# Patient Record
Sex: Male | Born: 1980 | Race: White | Hispanic: No | Marital: Married | Smoking: Never smoker
Health system: Southern US, Community
[De-identification: ages and names within clinical notes are randomized; demographics above are authoritative.]

## PROBLEM LIST (undated history)

## (undated) HISTORY — PX: APPENDECTOMY: SHX54

---

## 2017-09-22 ENCOUNTER — Emergency Department (HOSPITAL_COMMUNITY)
Admission: EM | Admit: 2017-09-22 | Discharge: 2017-09-22 | Disposition: A | Discharge status: Home / Self Care | Payer: 59 | Attending: Emergency Medicine | Admitting: Emergency Medicine

## 2017-09-22 ENCOUNTER — Other Ambulatory Visit: Payer: Self-pay

## 2017-09-22 ENCOUNTER — Encounter (HOSPITAL_COMMUNITY): Payer: Self-pay

## 2017-09-22 ENCOUNTER — Emergency Department (HOSPITAL_COMMUNITY): Payer: 59

## 2017-09-22 DIAGNOSIS — M545 Low back pain, unspecified: Secondary | ICD-10-CM

## 2017-09-22 DIAGNOSIS — Z79899 Other long term (current) drug therapy: Secondary | ICD-10-CM | POA: Diagnosis not present

## 2017-09-22 MED ORDER — METHOCARBAMOL 500 MG PO TABS: 500.0000 mg | ORAL_TABLET | Freq: Two times a day (BID) | ORAL | 0 refills | Status: DC

## 2017-09-22 MED ORDER — KETOROLAC TROMETHAMINE 30 MG/ML IJ SOLN
30.0000 mg | Freq: Once | INTRAMUSCULAR | Status: AC
  Administered 2017-09-22: 30 mg via INTRAMUSCULAR
  Filled 2017-09-22: qty 1

## 2017-09-22 MED ORDER — OXYCODONE HCL 5 MG PO TABS
5.0000 mg | ORAL_TABLET | Freq: Once | ORAL | Status: AC
  Administered 2017-09-22: 5 mg via ORAL
  Filled 2017-09-22: qty 1

## 2017-09-22 MED ORDER — IBUPROFEN 600 MG PO TABS: 600.0000 mg | ORAL_TABLET | Freq: Four times a day (QID) | ORAL | 0 refills | Status: DC | PRN

## 2017-09-22 MED ORDER — METHOCARBAMOL 500 MG PO TABS
500.0000 mg | ORAL_TABLET | Freq: Once | ORAL | Status: AC
  Administered 2017-09-22: 500 mg via ORAL
  Filled 2017-09-22: qty 1

## 2017-09-22 MED ORDER — ACETAMINOPHEN 500 MG PO TABS: 500.0000 mg | ORAL_TABLET | Freq: Four times a day (QID) | ORAL | 0 refills | Status: DC | PRN

## 2017-09-22 NOTE — ED Notes (Signed)
Pt verbalized understanding discharge instructions and denies any further needs or questions at this time. VS stable, ambulatory and steady gait.   

## 2017-09-22 NOTE — ED Triage Notes (Signed)
Pt states he was at the gym and went to sit up from bench and had sudden severe back pain. Pt states he is unable to stand up straight and had difficulty walking. Pt denies numbness or tingling.

## 2017-09-22 NOTE — Discharge Instructions (Signed)
Please see the information and instructions below regarding your visit.  Your diagnoses today include:  1. Acute bilateral low back pain without sciatica    About diagnosis. Most episodes of acute low back pain are self-limited. Your exam was reassuring today that the source of your pain is not affecting the spinal cord and nerves that originate in the spinal cord.   If you have a history of disc herniation or arthritis in your spine, the nerves exiting the spine on one side get inflamed. This can cause severe pain. We call this radiculopathy. We do not always know what causes the sudden inflammation.  Tests performed today include: See side panel of your discharge paperwork for testing performed today. Vital signs are listed at the bottom of these instructions.   X-ray showed no fracture or malalignment.  Medications prescribed:    Take any prescribed medications only as prescribed, and any over the counter medications only as directed on the packaging.  You are prescribed Robaxin, a muscle relaxant. Some common side effects of this medication include:  Feeling sleepy.  Dizziness. Take care upon going from a seated to a standing position.  Dry mouth.  Feeling tired or weak.  Hard stools (constipation).  Upset stomach. These are not all of the side effects that may occur. If you have questions about side effects, call your doctor. Call your primary care provider for medical advice about side effects.  This medication can be sedating. Only take this medication as needed. Please do not combine with alcohol. Do not drive or operate machinery while taking this medication.   This medication can interact with some other medications. Make sure to tell any provider you are taking this medication before they prescribe you a new medication.   You are prescribed ibuprofen, a non-steroidal anti-inflammatory agent (NSAID) for pain. You may take 600mg  every 6 hours as needed for pain. If still  requiring this medication around the clock for acute pain after 10 days, please see your primary healthcare provider.  Women who are pregnant, breastfeeding, or planning on becoming pregnant should not take non-steroidal anti-inflammatories such as Advil and Aleve. Tylenol is a safe over the counter pain reliever in pregnant women.  You may combine this medication with Tylenol, 1000 mg every 6 hours, so you are receiving something for pain every 3 hours.  This is not a long-term medication unless under the care and direction of your primary provider. Taking this medication long-term and not under the supervision of a healthcare provider could increase the risk of stomach ulcers, kidney problems, and cardiovascular problems such as high blood pressure.   You may also apply salon pas patches to your back where you are experiencing pain.  These are over-the-counter.  Home care instructions:   Low back pain gets worse the longer you stay stationary. Please keep moving and walking as tolerated. There are exercises included in this packet to perform as tolerated for your low back pain.   Apply heat to the areas that are painful. Avoid twisting or bending your trunk to lift something. Do not lift anything above 25 lbs while recovering from this flare of low back pain.  Please follow any educational materials contained in this packet.   Follow-up instructions: Please follow-up with your primary care provider as needed for further evaluation of your symptoms if they are not completely improved.   Please follow up with Dr. Carola Frost of sports medicine.  Return instructions:  Please return to the Emergency Department  if you experience worsening symptoms.  Please return for any fever or chills in the setting of your back pain, weakness in the muscles of the legs, numbness in your legs and feet that is new or changing, numbness in the area where you wipe, retention of your urine, loss of bowel or bladder  control, or problems with walking. Please return if you have any other emergent concerns.  Additional Information:   Your vital signs today were: BP 125/72    Pulse 95    Temp 97.8 F (36.6 C) (Oral)    Resp 18    Ht 6\' 1"  (1.854 m)    Wt 104.3 kg (230 lb)    SpO2 99%    BMI 30.34 kg/m  If your blood pressure (BP) was elevated on multiple readings during this visit above 130 for the top number or above 80 for the bottom number, please have this repeated by your primary care provider within one month. --------------  Thank you for allowing us to participate in your care today.

## 2017-09-22 NOTE — ED Provider Notes (Signed)
[facility] EMERGENCY DEPARTMENT Provider Note   CSN: [id number] Arrival date & time: 09/22/17  1119     History   Chief Complaint Chief Complaint  Patient presents with  . Back Pain    HPI Antonio Powers is a 37 y.o. male.  HPI   Patient is a 37 year old male with no significant past medical history presenting for low back pain after lifting weights this morning.  Patient reports he was lifting from a squatting position when he felt a sudden "spasm" in a bandlike pattern across his lower back.  Patient reports is been persistently painful since and he cannot stand up straight due to the pain.  Patient denies any weakness or numbness in lower extremities, saddle anesthesia, loss of bowel or bladder control, urinary retention with overflow incontinence.  Patient denies any history of cancer, IVDU, or spinal procedures.  No remedies tried prior to arrival for symptoms.  History reviewed. No pertinent past medical history.  There are no active problems to display for this patient.   History reviewed. No pertinent surgical history.      Home Medications    Prior to Admission medications   Medication Sig Start Date End Date Taking? Authorizing Provider  Multiple Vitamins-Minerals (MULTIVITAMIN WITH MINERALS) tablet Take 1 tablet by mouth daily.   Yes [provider]  naproxen sodium (ALEVE) 220 MG tablet Take 440 mg by mouth.   Yes [provider]  Protein POWD Take 1 Scoop by mouth daily. DR jim protein power   Yes [provider]    Family History History reviewed. No pertinent family history.  Social History Social History   Tobacco Use  . Smoking status: Never Smoker  . Smokeless tobacco: Never Used  Substance Use Topics  . Alcohol use: Not Currently    Frequency: Never  . Drug use: Never     Allergies   Patient has no known allergies.   Review of Systems Review of Systems  Constitutional: Negative for  chills and fever.  Gastrointestinal: Negative for abdominal pain, nausea and vomiting.  Genitourinary: Negative for difficulty urinating.  Musculoskeletal: Positive for arthralgias and back pain.  Neurological: Negative for weakness and numbness.     Physical Exam Updated Vital Signs BP 125/72   Pulse 95   Temp 97.8 F (36.6 C) (Oral)   Resp 18   Ht 6\' 1"  (1.854 m)   Wt 104.3 kg (230 lb)   SpO2 99%   BMI 30.34 kg/m   Physical Exam  Constitutional: He appears well-developed and well-nourished. No distress.  Sitting comfortably in bed.  HENT:  Head: Normocephalic and atraumatic.  Eyes: Conjunctivae are normal. Right eye exhibits no discharge. Left eye exhibits no discharge.  EOMs normal to gross examination.  Neck: Normal range of motion.  Cardiovascular: Normal rate and regular rhythm.  Intact, 2+ DP pulses b/l  Pulmonary/Chest:  Normal respiratory effort. Patient converses comfortably. No audible wheeze or stridor.  Abdominal: He exhibits no distension.  Musculoskeletal:  Spine Exam: Inspection/Palpation: No midline tenderness palpation of cervical, thoracic, or lumbar spine.  Patient has diffuse paraspinal muscular tenderness in the upper lumbar region. Strength: 5/5 throughout LE bilaterally (hip flexion/extension, adduction/abduction; knee flexion/extension; foot dorsiflexion/plantarflexion, inversion/eversion; great toe inversion) Sensation: Intact to light touch in proximal and distal LE bilaterally Reflexes: 3+ quadriceps and achilles reflexes, but symmetric.  Neurological: He is alert.  Cranial nerves intact to gross observation. Patient moves extremities without difficulty.  Skin: Skin is warm and dry. He  is not diaphoretic.  Psychiatric: He has a normal mood and affect. His behavior is normal. Judgment and thought content normal.  Nursing note and vitals reviewed.    ED Treatments / Results  Labs (all labs ordered are listed, but only abnormal results are  displayed) Labs Reviewed - No data to display  EKG Powers  Radiology Dg Lumbar Spine Complete  Result Date: 09/22/2017 CLINICAL DATA:  Pain after lifting weights EXAM: LUMBAR SPINE - COMPLETE 4+ VIEW COMPARISON:  Powers. FINDINGS: Frontal, lateral, spot lumbosacral lateral, and bilateral oblique views were obtained. There are 5 non-rib-bearing lumbar type vertebral bodies. There is slight lumbar levoscoliosis. There is no fracture or spondylolisthesis. Disc spaces appear normal. There is no appreciable facet arthropathy. IMPRESSION: Slight scoliosis. No fracture or spondylolisthesis. No evident arthropathy. Electronically Signed   By: Bretta BangWilliam  Woodruff III M.D.   On: 09/22/2017 15:35    Procedures Procedures (including critical care time)  Medications Ordered in ED Medications  oxyCODONE (Oxy IR/ROXICODONE) immediate release tablet 5 mg (5 mg Oral Given 09/22/17 1424)  ketorolac (TORADOL) 30 MG/ML injection 30 mg (30 mg Intramuscular Given 09/22/17 1424)  methocarbamol (ROBAXIN) tablet 500 mg (500 mg Oral Given 09/22/17 1424)     Initial Impression / Assessment and Plan / ED Course  I have reviewed the triage vital signs and the nursing notes.  Pertinent labs & imaging results that were available during my care of the patient were reviewed by me and considered in my medical decision making (see chart for details).     Patient denies any concerning symptoms suggestive of cauda equina requiring urgent imaging at this time such as loss of sensation in the lower extremities, lower extremity weakness, loss of bowel or bladder control, saddle anesthesia, urinary retention, fever/chills, IVDU. Exam demonstrated no  weakness today.  On reevaluation after pain control, patient was more ambulatory. Patient had acute onset of pain while lifting weights, but normal x-ray and no midline tenderness.  Patient strict symptomatically with muscle relaxants, NSAIDs, acetaminophen, and rest with restrictions for  lifting.  Patient given strict return precautions for any symptoms indicating worsening neurologic function in the lower extremities.  Patient and family are in understanding and agree with the plan of care.  Final Clinical Impressions(s) / ED Diagnoses   Final diagnoses:  Acute bilateral low back pain without sciatica    ED Discharge Orders        Ordered    methocarbamol (ROBAXIN) 500 MG tablet  2 times daily     09/22/17 1649    ibuprofen (ADVIL,MOTRIN) 600 MG tablet  Every 6 hours PRN     09/22/17 1649    acetaminophen (TYLENOL) 500 MG tablet  Every 6 hours PRN     09/22/17 1649       Elisha PonderMurray, Joseph Johns B, PA-C 09/22/17 1828    Terrilee FilesButler, Masud C, MD 09/22/17 [phone]

## 2018-03-06 ENCOUNTER — Other Ambulatory Visit: Payer: Self-pay | Admitting: Orthopedic Surgery

## 2018-03-06 DIAGNOSIS — M5416 Radiculopathy, lumbar region: Secondary | ICD-10-CM

## 2018-03-13 ENCOUNTER — Ambulatory Visit
Admission: RE | Admit: 2018-03-13 | Discharge: 2018-03-13 | Disposition: A | Discharge status: Home / Self Care | Payer: 59 | Source: Ambulatory Visit | Attending: Orthopedic Surgery | Admitting: Orthopedic Surgery

## 2018-03-13 DIAGNOSIS — M5416 Radiculopathy, lumbar region: Secondary | ICD-10-CM

## 2018-03-13 MED ORDER — METHYLPREDNISOLONE ACETATE 40 MG/ML INJ SUSP (RADIOLOG
120.0000 mg | Freq: Once | INTRAMUSCULAR | Status: AC
  Administered 2018-03-13: 120 mg via EPIDURAL

## 2018-03-13 MED ORDER — IOPAMIDOL (ISOVUE-M 200) INJECTION 41%
1.0000 mL | Freq: Once | INTRAMUSCULAR | Status: AC
  Administered 2018-03-13: 1 mL via EPIDURAL

## 2018-03-13 NOTE — Discharge Instructions (Signed)

## 2018-03-31 ENCOUNTER — Other Ambulatory Visit: Payer: Self-pay | Admitting: Orthopedic Surgery

## 2018-03-31 DIAGNOSIS — M5416 Radiculopathy, lumbar region: Secondary | ICD-10-CM

## 2018-04-02 ENCOUNTER — Ambulatory Visit
Admission: RE | Admit: 2018-04-02 | Discharge: 2018-04-02 | Disposition: A | Discharge status: Home / Self Care | Payer: 59 | Source: Ambulatory Visit | Attending: Orthopedic Surgery | Admitting: Orthopedic Surgery

## 2018-04-02 DIAGNOSIS — M5416 Radiculopathy, lumbar region: Secondary | ICD-10-CM

## 2018-04-02 MED ORDER — METHYLPREDNISOLONE ACETATE 40 MG/ML INJ SUSP (RADIOLOG
120.0000 mg | Freq: Once | INTRAMUSCULAR | Status: AC
  Administered 2018-04-02: 120 mg via EPIDURAL

## 2018-04-02 MED ORDER — IOPAMIDOL (ISOVUE-M 200) INJECTION 41%
1.0000 mL | Freq: Once | INTRAMUSCULAR | Status: AC
  Administered 2018-04-02: 1 mL via EPIDURAL

## 2018-07-18 ENCOUNTER — Other Ambulatory Visit: Payer: Self-pay | Admitting: Orthopedic Surgery

## 2018-07-18 DIAGNOSIS — M5416 Radiculopathy, lumbar region: Secondary | ICD-10-CM

## 2018-08-04 ENCOUNTER — Ambulatory Visit
Admission: RE | Admit: 2018-08-04 | Discharge: 2018-08-04 | Disposition: A | Discharge status: Home / Self Care | Payer: 59 | Source: Ambulatory Visit | Attending: Orthopedic Surgery | Admitting: Orthopedic Surgery

## 2018-08-04 ENCOUNTER — Other Ambulatory Visit: Payer: Self-pay

## 2018-08-04 DIAGNOSIS — M5416 Radiculopathy, lumbar region: Secondary | ICD-10-CM

## 2018-08-04 MED ORDER — IOPAMIDOL (ISOVUE-M 200) INJECTION 41%
1.0000 mL | Freq: Once | INTRAMUSCULAR | Status: AC
  Administered 2018-08-04: 1 mL via EPIDURAL

## 2018-08-04 MED ORDER — METHYLPREDNISOLONE ACETATE 40 MG/ML INJ SUSP (RADIOLOG
120.0000 mg | Freq: Once | INTRAMUSCULAR | Status: AC
  Administered 2018-08-04: 120 mg via EPIDURAL

## 2018-08-04 NOTE — Discharge Instructions (Signed)

## 2018-09-12 ENCOUNTER — Other Ambulatory Visit: Payer: Self-pay | Admitting: Orthopedic Surgery

## 2018-09-19 ENCOUNTER — Encounter (HOSPITAL_COMMUNITY): Payer: Self-pay | Admitting: Vascular Surgery

## 2018-09-19 NOTE — Pre-Procedure Instructions (Signed)
Antonio CowmanMichael Powers  09/19/2018      [facility] [place] [address] [place] [address] Phone: [phone] Fax: [phone]    Your procedure is scheduled on July 23  Report [facility] En[facility] A at 8:30 A.M.  Call this number if you have problems the morning of surgery:  [phone]   Remember:  Do not eat  after midnight.  You may drink clear liquids until 7:30A.M.  Clear liquids allowed are:                    Water, Juice (non-citric and without pulp), Carbonated beverages, Clear Tea, Black Coffee only, Plain Jell-O only, Gatorade and Plain Popsicles only    Take these medicines the morning of surgery with A SIP OF WATER :              Tylenol #3 if needed              Gabapentin (neurontin) if needed                          7 days prior to surgery STOP taking any Aspirin (unless otherwise instructed by your surgeon), Aleve, Naproxen, Ibuprofen, Motrin, Advil, Goody's, BC's, all herbal medications, fish oil, and all vitamins.     Do not wear jewelry.  Do not wear lotions, powders, or perfumes, or deodorant.  Do not shave 48 hours prior to surgery.  Men may shave face and neck.  Do not bring valuables to the hospital.  [facility] any belongings or valuables.  Contacts, dentures or bridgework may not be worn into surgery.  Leave your suitcase in the car.  After surgery it may be brought to your room.  For patients admitted to the hospital, discharge time will be determined by your treatment team.  Patients discharged the day of surgery will not be allowed to drive home.    Special instructions:   [place]- Preparing For Surgery  Before surgery, you can play an important role. Because skin is not sterile, your skin needs to be as free of germs as possible. You can reduce the number of germs on your skin by washing with CHG (chlorahexidine gluconate) Soap before surgery.  CHG is  an antiseptic cleaner which kills germs and bonds with the skin to continue killing germs even after washing.    Oral Hygiene is also important to reduce your risk of infection.  Remember - BRUSH YOUR TEETH THE MORNING OF SURGERY WITH YOUR REGULAR TOOTHPASTE  Please do not use if you have an allergy to CHG or antibacterial soaps. If your skin becomes reddened/irritated stop using the CHG.  Do not shave (including legs and underarms) for at least 48 hours prior to first CHG shower. It is OK to shave your face.  Please follow these instructions carefully.   1. Shower the NIGHT BEFORE SURGERY and the MORNING OF SURGERY with CHG.   2. If you chose to wash your hair, wash your hair first as usual with your normal shampoo.  3. After you shampoo, rinse your hair and body thoroughly to remove the shampoo.  4. Use CHG as you would any other liquid soap. You can apply CHG directly to the skin and wash gently with a scrungie or a clean washcloth.   5. Apply the CHG Soap to your body ONLY FROM  THE NECK DOWN.  Do not use on open wounds or open sores. Avoid contact with your eyes, ears, mouth and genitals (private parts). Wash Face and genitals (private parts)  with your normal soap.  6. Wash thoroughly, paying special attention to the area where your surgery will be performed.  7. Thoroughly rinse your body with warm water from the neck down.  8. DO NOT shower/wash with your normal soap after using and rinsing off the CHG Soap.  9. Pat yourself dry with a CLEAN TOWEL.  10. Wear CLEAN PAJAMAS to bed the night before surgery, wear comfortable clothes the morning of surgery  11. Place CLEAN SHEETS on your bed the night of your first shower and DO NOT SLEEP WITH PETS.    Day of Surgery:  Do not apply any deodorants/lotions.  Please wear clean clothes to the hospital/surgery center.   Remember to brush your teeth WITH YOUR REGULAR TOOTHPASTE.    Please read over the following fact sheets that  you were given. Coughing and Deep Breathing and Surgical Site Infection Prevention

## 2018-09-22 ENCOUNTER — Other Ambulatory Visit (HOSPITAL_COMMUNITY): Payer: 59

## 2018-09-22 ENCOUNTER — Inpatient Hospital Stay (HOSPITAL_COMMUNITY)
Admission: RE | Admit: 2018-09-22 | Discharge: 2018-09-22 | Disposition: A | Discharge status: Home / Self Care | Payer: 59 | Source: Ambulatory Visit

## 2018-09-25 ENCOUNTER — Inpatient Hospital Stay: Admit: 2018-09-25 | Payer: 59 | Admitting: Orthopedic Surgery

## 2018-09-29 NOTE — Pre-Procedure Instructions (Signed)
Antonio Powers  09/29/2018      [facility] - [place], [place] - [place] [address] Phone: [phone] Fax: [phone]    Your procedure is scheduled on Aug. 6  Report to [facility] Entrance A at 1:00 P.M.  Call this number if you have problems the morning of surgery:  [phone]   Remember:  Do not eat  after midnight.  You may drink clear liquids until 12:00 P.M.  Clear liquids allowed are:                    Water, Juice (non-citric and without pulp), Carbonated beverages, Clear Tea, Black Coffee only, Plain Jell-O only, Gatorade and Plain Popsicles only                Please complete your PRE-SURGERY ENSURE that was provided to you by 12:00 noon the morning of surgery.  Please, if able, drink it in one setting. DO NOT SIP.    Take these medicines the morning of surgery with A SIP OF WATER :              Tylenol #3 if needed             Gabapentin (neurontin)               7 days prior to surgery STOP taking any Aspirin (unless otherwise instructed by your surgeon), Aleve, Naproxen, Ibuprofen, Motrin, Advil, Goody's, BC's, all herbal medications, fish oil, and all vitamins.      Do not wear jewelry.  Do not wear lotions, powders, or perfumes, or deodorant.  Do not shave 48 hours prior to surgery.  Men may shave face and neck.  Do not bring valuables to the hospital.  [facility] is not responsible for any belongings or valuables.  Contacts, dentures or bridgework may not be worn into surgery.  Leave your suitcase in the car.  After surgery it may be brought to your room.  For patients admitted to the hospital, discharge time will be determined by your treatment team.  Patients discharged the day of surgery will not be allowed to drive home.    Special instructions:   [place]- Preparing For Surgery  Before surgery, you can play an important role. Because skin is not sterile, your skin needs to  be as free of germs as possible. You can reduce the number of germs on your skin by washing with CHG (chlorahexidine gluconate) Soap before surgery.  CHG is an antiseptic cleaner which kills germs and bonds with the skin to continue killing germs even after washing.    Oral Hygiene is also important to reduce your risk of infection.  Remember - BRUSH YOUR TEETH THE MORNING OF SURGERY WITH YOUR REGULAR TOOTHPASTE  Please do not use if you have an allergy to CHG or antibacterial soaps. If your skin becomes reddened/irritated stop using the CHG.  Do not shave (including legs and underarms) for at least 48 hours prior to first CHG shower. It is OK to shave your face.  Please follow these instructions carefully.   1. Shower the NIGHT BEFORE SURGERY and the MORNING OF SURGERY with CHG.   2. If you chose to wash your hair, wash your hair first as usual with your normal shampoo.  3. After you shampoo, rinse your hair and body thoroughly to remove the shampoo.  4. Use CHG as you would any other  liquid soap. You can apply CHG directly to the skin and wash gently with a scrungie or a clean washcloth.   5. Apply the CHG Soap to your body ONLY FROM THE NECK DOWN.  Do not use on open wounds or open sores. Avoid contact with your eyes, ears, mouth and genitals (private parts). Wash Face and genitals (private parts)  with your normal soap.  6. Wash thoroughly, paying special attention to the area where your surgery will be performed.  7. Thoroughly rinse your body with warm water from the neck down.  8. DO NOT shower/wash with your normal soap after using and rinsing off the CHG Soap.  9. Pat yourself dry with a CLEAN TOWEL.  10. Wear CLEAN PAJAMAS to bed the night before surgery, wear comfortable clothes the morning of surgery  11. Place CLEAN SHEETS on your bed the night of your first shower and DO NOT SLEEP WITH PETS.    Day of Surgery:  Do not apply any deodorants/lotions.  Please wear clean  clothes to the hospital/surgery center.   Remember to brush your teeth WITH YOUR REGULAR TOOTHPASTE.    Please read over the following fact sheets that you were given. Coughing and Deep Breathing, MRSA Information and Surgical Site Infection Prevention

## 2018-09-30 ENCOUNTER — Other Ambulatory Visit: Payer: Self-pay

## 2018-09-30 ENCOUNTER — Encounter (HOSPITAL_COMMUNITY): Payer: Self-pay

## 2018-09-30 ENCOUNTER — Encounter (HOSPITAL_COMMUNITY)
Admission: RE | Admit: 2018-09-30 | Discharge: 2018-09-30 | Disposition: A | Discharge status: Home / Self Care | Payer: 59 | Source: Ambulatory Visit | Attending: Orthopedic Surgery | Admitting: Orthopedic Surgery

## 2018-09-30 DIAGNOSIS — Z01812 Encounter for preprocedural laboratory examination: Secondary | ICD-10-CM | POA: Diagnosis present

## 2018-09-30 LAB — SURGICAL PCR SCREEN
MRSA, PCR: NEGATIVE
Staphylococcus aureus: NEGATIVE

## 2018-09-30 LAB — COMPREHENSIVE METABOLIC PANEL
ALT: 47 U/L — ABNORMAL HIGH (ref 0–44)
AST: 23 U/L (ref 15–41)
Albumin: 3.8 g/dL (ref 3.5–5.0)
Alkaline Phosphatase: 55 U/L (ref 38–126)
Anion gap: 9 (ref 5–15)
BUN: 23 mg/dL — ABNORMAL HIGH (ref 6–20)
CO2: 24 mmol/L (ref 22–32)
Calcium: 9.1 mg/dL (ref 8.9–10.3)
Chloride: 105 mmol/L (ref 98–111)
Creatinine, Ser: 1.28 mg/dL — ABNORMAL HIGH (ref 0.61–1.24)
GFR calc Af Amer: >60 mL/min (ref >60)
GFR calc non Af Amer: >60 mL/min (ref >60)
Glucose, Bld: 93 mg/dL (ref 70–99)
Potassium: 4.4 mmol/L (ref 3.5–5.1)
Sodium: 138 mmol/L (ref 135–145)
Total Bilirubin: 0.8 mg/dL (ref 0.3–1.2)
Total Protein: 7.2 g/dL (ref 6.5–8.1)

## 2018-09-30 LAB — APTT: aPTT: 31 seconds (ref 24–36)

## 2018-09-30 LAB — CBC WITH DIFFERENTIAL/PLATELET
Abs Immature Granulocytes: 0.02 10*3/uL (ref 0.00–0.07)
Basophils Absolute: 0 10*3/uL (ref 0.0–0.1)
Basophils Relative: 1 %
Eosinophils Absolute: 0.2 10*3/uL (ref 0.0–0.5)
Eosinophils Relative: 3 %
HCT: 41.4 % (ref 39.0–52.0)
Hemoglobin: 13.7 g/dL (ref 13.0–17.0)
Immature Granulocytes: 0 %
Lymphocytes Relative: 21 %
Lymphs Abs: 1.3 10*3/uL (ref 0.7–4.0)
MCH: 28.8 pg (ref 26.0–34.0)
MCHC: 33.1 g/dL (ref 30.0–36.0)
MCV: 87 fL (ref 80.0–100.0)
Monocytes Absolute: 0.9 10*3/uL (ref 0.1–1.0)
Monocytes Relative: 13 %
Neutro Abs: 4 10*3/uL (ref 1.7–7.7)
Neutrophils Relative %: 62 %
Platelets: 200 10*3/uL (ref 150–400)
RBC: 4.76 MIL/uL (ref 4.22–5.81)
RDW: 11.9 % (ref 11.5–15.5)
WBC: 6.4 10*3/uL (ref 4.0–10.5)
nRBC: 0 % (ref 0.0–0.2)

## 2018-09-30 LAB — URINALYSIS, ROUTINE W REFLEX MICROSCOPIC
Bilirubin Urine: NEGATIVE
Glucose, UA: NEGATIVE mg/dL
Hgb urine dipstick: NEGATIVE
Ketones, ur: NEGATIVE mg/dL
Leukocytes,Ua: NEGATIVE
Nitrite: NEGATIVE
Protein, ur: NEGATIVE mg/dL
Specific Gravity, Urine: 1.03 (ref 1.005–1.030)
pH: 5 (ref 5.0–8.0)

## 2018-09-30 LAB — TYPE AND SCREEN
ABO/RH(D): O NEG
Antibody Screen: NEGATIVE

## 2018-09-30 LAB — ABO/RH: ABO/RH(D): O NEG

## 2018-09-30 LAB — PROTIME-INR
INR: 1 (ref 0.8–1.2)
Prothrombin Time: 13.2 seconds (ref 11.4–15.2)

## 2018-09-30 NOTE — Progress Notes (Signed)
PCP - denies Cardiologist - denies   Chest x-ray - N/A EKG - N/A Stress Test -denies  ECHO - denies Cardiac Cath - denies  Sleep Study - denies CPAP - denies  Blood Thinner Instructions:N/A Aspirin Instructions:N/A  Anesthesia review: No  Patient denies shortness of breath, fever, cough and chest pain at PAT appointment   Patient verbalized understanding of instructions that were given to them at the PAT appointment. Patient was also instructed that they will need to review over the PAT instructions again at home before surgery.  Scheduled for COVID testing on 10/06/18. Pt aware of quarantine rules after testing.    Coronavirus Screening  Have you experienced the following symptoms:  Cough yes/no: No Fever (>100.16F)  yes/no: No Runny nose yes/no: No Sore throat yes/no: No Difficulty breathing/shortness of breath  yes/no: No  Have you or a family member traveled in the last 14 days and where? yes/no: No   If the patient indicates "YES" to the above questions, their PAT will be rescheduled to limit the exposure to others and, the surgeon will be notified. THE PATIENT WILL NEED TO BE ASYMPTOMATIC FOR 14 DAYS.   If the patient is not experiencing any of these symptoms, the PAT nurse will instruct them to NOT bring anyone with them to their appointment since they may have these symptoms or traveled as well.   Please remind your patients and families that hospital visitation restrictions are in effect and the importance of the restrictions.

## 2018-09-30 NOTE — Pre-Procedure Instructions (Addendum)
[place], [place] [address] Phone: [phone] Fax: [phone]      Your procedure is scheduled on Thursday, August 6th.  Report to [facility]tr[facility] "A" at 1:00 P.M., and check in at the Admitting office.  Call this number if you have problems the morning of surgery:  [phone]  Call [phone] if you have any questions prior to your surgery date Monday-Friday 8am-4pm    Remember:  Do not eat after midnight the night before your surgery  You may drink clear liquids until 12:00 P.M. the afternoon of your surgery.   Clear liquids allowed are: Water, Non-Citrus Juices (without pulp), Carbonated Beverages, Clear Tea, Black Coffee Only, and Gatorade.  Please complete your PRE-SURGERY ENSURE that was provided to you by 12 NOON the afternoon of surgery.  Please, if able, drink it in one setting. DO NOT SIP.    Take these medicines the morning of surgery with A SIP OF WATER  acetaminophen-codeine (TYLENOL #3)-as needed. gabapentin (NEURONTIN)-as needed.   7 days prior to surgery STOP taking any Aspirin (unless otherwise instructed by your surgeon), Aleve, Naproxen, Ibuprofen, Motrin, Advil, Goody's, BC's, all herbal medications, fish oil, and all vitamins.    The Morning of Surgery  Do not wear jewelry.  Do not wear lotions, powders, or colognes, or deodorant  Do not shave 48 hours prior to surgery.  Men may shave face and neck.  Do not bring valuables to the hospital.  [facility] is not responsible for any belongings or valuables.  If you are a smoker, DO NOT Smoke 24 hours prior to surgery IF you wear a CPAP at night please bring your mask, tubing, and machine the morning of surgery   Remember that you must have someone to transport you home after your surgery, and remain with you for 24 hours if you are discharged the same day.   Contacts, glasses, hearing aids, dentures or  bridgework may not be worn into surgery.    Leave your suitcase in the car.  After surgery it may be brought to your room.  For patients admitted to the hospital, discharge time will be determined by your treatment team.  Patients discharged the day of surgery will not be allowed to drive home.    Special instructions:   [facility]- Preparing For Surgery  Before surgery, you can play an important role. Because skin is not sterile, your skin needs to be as free of germs as possible. You can reduce the number of germs on your skin by washing with CHG (chlorahexidine gluconate) Soap before surgery.  CHG is an antiseptic cleaner which kills germs and bonds with the skin to continue killing germs even after washing.    Oral Hygiene is also important to reduce your risk of infection.  Remember - BRUSH YOUR TEETH THE MORNING OF SURGERY WITH YOUR REGULAR TOOTHPASTE  Please do not use if you have an allergy to CHG or antibacterial soaps. If your skin becomes reddened/irritated stop using the CHG.  Do not shave (including legs and underarms) for at least 48 hours prior to first CHG shower. It is OK to shave your face.  Please follow these instructions carefully.   1. Shower the NIGHT BEFORE SURGERY and the MORNING OF SURGERY with CHG Soap.   2. If you chose to wash your hair, wash your hair first as usual with your normal shampoo.  3. After  you shampoo, rinse your hair and body thoroughly to remove the shampoo.  4. Use CHG as you would any other liquid soap. You can apply CHG directly to the skin and wash gently with a scrungie or a clean washcloth.   5. Apply the CHG Soap to your body ONLY FROM THE NECK DOWN.  Do not use on open wounds or open sores. Avoid contact with your eyes, ears, mouth and genitals (private parts). Wash Face and genitals (private parts)  with your normal soap.   6. Wash thoroughly, paying special attention to the area where your surgery will be  performed.  7. Thoroughly rinse your body with warm water from the neck down.  8. DO NOT shower/wash with your normal soap after using and rinsing off the CHG Soap.  9. Pat yourself dry with a CLEAN TOWEL.  10. Wear CLEAN PAJAMAS to bed the night before surgery, wear comfortable clothes the morning of surgery  11. Place CLEAN SHEETS on your bed the night of your first shower and DO NOT SLEEP WITH PETS.    Day of Surgery:  Do not apply any deodorants/lotions. Please shower the morning of surgery with the CHG soap  Please wear clean clothes to the hospital/surgery center.   Remember to brush your teeth WITH YOUR REGULAR TOOTHPASTE.   Please read over the following fact sheets that you were given.

## 2018-10-06 ENCOUNTER — Other Ambulatory Visit (HOSPITAL_COMMUNITY)
Admission: RE | Admit: 2018-10-06 | Discharge: 2018-10-06 | Disposition: A | Discharge status: Home / Self Care | Payer: 59 | Source: Ambulatory Visit | Attending: Orthopedic Surgery | Admitting: Orthopedic Surgery

## 2018-10-06 DIAGNOSIS — Z20828 Contact with and (suspected) exposure to other viral communicable diseases: Secondary | ICD-10-CM | POA: Insufficient documentation

## 2018-10-06 DIAGNOSIS — Z01812 Encounter for preprocedural laboratory examination: Secondary | ICD-10-CM | POA: Insufficient documentation

## 2018-10-06 LAB — SARS CORONAVIRUS 2 (TAT 6-24 HRS): SARS Coronavirus 2: NEGATIVE

## 2018-10-09 ENCOUNTER — Encounter (HOSPITAL_COMMUNITY)
Admission: RE | Disposition: A | Discharge status: Home / Self Care | Payer: Self-pay | Source: Home / Self Care | Attending: Orthopedic Surgery

## 2018-10-09 ENCOUNTER — Inpatient Hospital Stay (HOSPITAL_COMMUNITY): Payer: 59

## 2018-10-09 ENCOUNTER — Encounter (HOSPITAL_COMMUNITY): Payer: Self-pay | Admitting: General Practice

## 2018-10-09 ENCOUNTER — Inpatient Hospital Stay (HOSPITAL_COMMUNITY)
Admission: RE | Admit: 2018-10-09 | Discharge: 2018-10-10 | DRG: 455 | Disposition: A | Discharge status: Home / Self Care | Payer: 59 | Attending: Orthopedic Surgery | Admitting: Orthopedic Surgery

## 2018-10-09 ENCOUNTER — Other Ambulatory Visit: Payer: Self-pay

## 2018-10-09 ENCOUNTER — Inpatient Hospital Stay (HOSPITAL_COMMUNITY): Payer: 59 | Admitting: Anesthesiology

## 2018-10-09 DIAGNOSIS — M48061 Spinal stenosis, lumbar region without neurogenic claudication: Secondary | ICD-10-CM | POA: Diagnosis present

## 2018-10-09 DIAGNOSIS — Z79899 Other long term (current) drug therapy: Secondary | ICD-10-CM | POA: Diagnosis not present

## 2018-10-09 DIAGNOSIS — G8929 Other chronic pain: Secondary | ICD-10-CM | POA: Diagnosis present

## 2018-10-09 DIAGNOSIS — M5416 Radiculopathy, lumbar region: Secondary | ICD-10-CM

## 2018-10-09 DIAGNOSIS — M5410 Radiculopathy, site unspecified: Secondary | ICD-10-CM | POA: Diagnosis present

## 2018-10-09 DIAGNOSIS — Z791 Long term (current) use of non-steroidal anti-inflammatories (NSAID): Secondary | ICD-10-CM

## 2018-10-09 DIAGNOSIS — M5116 Intervertebral disc disorders with radiculopathy, lumbar region: Principal | ICD-10-CM | POA: Diagnosis present

## 2018-10-09 DIAGNOSIS — Z419 Encounter for procedure for purposes other than remedying health state, unspecified: Secondary | ICD-10-CM

## 2018-10-09 HISTORY — PX: TRANSFORAMINAL LUMBAR INTERBODY FUSION (TLIF) WITH PEDICLE SCREW FIXATION 1 LEVEL: SHX6141

## 2018-10-09 SURGERY — TRANSFORAMINAL LUMBAR INTERBODY FUSION (TLIF) WITH PEDICLE SCREW FIXATION 1 LEVEL
Anesthesia: General | Site: Spine Lumbar

## 2018-10-09 SURGERY — TRANSFORAMINAL LUMBAR INTERBODY FUSION (TLIF) WITH PEDICLE SCREW FIXATION 1 LEVEL
Anesthesia: General | Laterality: Right

## 2018-10-09 MED ORDER — PROPOFOL 500 MG/50ML IV EMUL
INTRAVENOUS | Status: DC | PRN
  Administered 2018-10-09: 95 ug/kg/min via INTRAVENOUS

## 2018-10-09 MED ORDER — DIAZEPAM 5 MG PO TABS
5.0000 mg | ORAL_TABLET | Freq: Four times a day (QID) | ORAL | Status: DC | PRN
  Administered 2018-10-09 – 2018-10-10 (×4): 5 mg via ORAL
  Filled 2018-10-09 (×3): qty 1

## 2018-10-09 MED ORDER — SUCCINYLCHOLINE CHLORIDE 200 MG/10ML IV SOSY
PREFILLED_SYRINGE | INTRAVENOUS | Status: DC | PRN
  Administered 2018-10-09: 100 mg via INTRAVENOUS

## 2018-10-09 MED ORDER — PROMETHAZINE HCL 25 MG/ML IJ SOLN: 6.2500 mg | INTRAMUSCULAR | Status: DC | PRN

## 2018-10-09 MED ORDER — ZOLPIDEM TARTRATE 5 MG PO TABS: 5.0000 mg | ORAL_TABLET | Freq: Every evening | ORAL | Status: DC | PRN

## 2018-10-09 MED ORDER — OXYCODONE-ACETAMINOPHEN 5-325 MG PO TABS
ORAL_TABLET | ORAL | Status: AC
  Filled 2018-10-09: qty 2

## 2018-10-09 MED ORDER — HYDROMORPHONE HCL 1 MG/ML IJ SOLN
INTRAMUSCULAR | Status: AC
  Filled 2018-10-09: qty 1

## 2018-10-09 MED ORDER — CEFAZOLIN SODIUM-DEXTROSE 2-4 GM/100ML-% IV SOLN
2.0000 g | Freq: Once | INTRAVENOUS | Status: AC
  Administered 2018-10-09: 2 g via INTRAVENOUS

## 2018-10-09 MED ORDER — SENNOSIDES-DOCUSATE SODIUM 8.6-50 MG PO TABS: 1.0000 | ORAL_TABLET | Freq: Every evening | ORAL | Status: DC | PRN

## 2018-10-09 MED ORDER — HYDROMORPHONE HCL 1 MG/ML IJ SOLN
0.2500 mg | INTRAMUSCULAR | Status: DC | PRN
  Administered 2018-10-09: 1 mg via INTRAVENOUS
  Administered 2018-10-09: 0.5 mg via INTRAVENOUS

## 2018-10-09 MED ORDER — 0.9 % SODIUM CHLORIDE (POUR BTL) OPTIME
TOPICAL | Status: DC | PRN
  Administered 2018-10-09 (×3): 1000 mL

## 2018-10-09 MED ORDER — PHENOL 1.4 % MT LIQD: 1.0000 | OROMUCOSAL | Status: DC | PRN

## 2018-10-09 MED ORDER — ONDANSETRON HCL 4 MG PO TABS: 4.0000 mg | ORAL_TABLET | Freq: Four times a day (QID) | ORAL | Status: DC | PRN

## 2018-10-09 MED ORDER — SODIUM CHLORIDE 0.9 % IV SOLN
INTRAVENOUS | Status: DC | PRN
  Administered 2018-10-09: 20 ug/min via INTRAVENOUS

## 2018-10-09 MED ORDER — LACTATED RINGERS IV SOLN
INTRAVENOUS | Status: DC
  Administered 2018-10-09: 09:00:00 via INTRAVENOUS

## 2018-10-09 MED ORDER — PROPOFOL 10 MG/ML IV BOLUS
INTRAVENOUS | Status: DC | PRN
  Administered 2018-10-09: 180 mg via INTRAVENOUS

## 2018-10-09 MED ORDER — OXYCODONE HCL 5 MG PO TABS: 5.0000 mg | ORAL_TABLET | Freq: Once | ORAL | Status: DC | PRN

## 2018-10-09 MED ORDER — FENTANYL CITRATE (PF) 100 MCG/2ML IJ SOLN
INTRAMUSCULAR | Status: DC | PRN
  Administered 2018-10-09: 100 ug via INTRAVENOUS
  Administered 2018-10-09: 50 ug via INTRAVENOUS
  Administered 2018-10-09: 100 ug via INTRAVENOUS

## 2018-10-09 MED ORDER — SODIUM CHLORIDE 0.9% FLUSH: 3.0000 mL | INTRAVENOUS | Status: DC | PRN

## 2018-10-09 MED ORDER — PROPOFOL 10 MG/ML IV BOLUS
INTRAVENOUS | Status: AC
  Filled 2018-10-09: qty 20

## 2018-10-09 MED ORDER — ONDANSETRON HCL 4 MG/2ML IJ SOLN: 4.0000 mg | Freq: Four times a day (QID) | INTRAMUSCULAR | Status: DC | PRN

## 2018-10-09 MED ORDER — ONDANSETRON HCL 4 MG/2ML IJ SOLN
INTRAMUSCULAR | Status: DC | PRN
  Administered 2018-10-09: 4 mg via INTRAVENOUS

## 2018-10-09 MED ORDER — ACETAMINOPHEN 650 MG RE SUPP: 650.0000 mg | RECTAL | Status: DC | PRN

## 2018-10-09 MED ORDER — THROMBIN 20000 UNITS EX SOLR
CUTANEOUS | Status: AC
  Filled 2018-10-09: qty 20000

## 2018-10-09 MED ORDER — MIDAZOLAM HCL 5 MG/5ML IJ SOLN
INTRAMUSCULAR | Status: DC | PRN
  Administered 2018-10-09: 2 mg via INTRAVENOUS

## 2018-10-09 MED ORDER — HYDROMORPHONE HCL 1 MG/ML IJ SOLN
INTRAMUSCULAR | Status: AC
  Filled 2018-10-09: qty 0.5

## 2018-10-09 MED ORDER — SODIUM CHLORIDE 0.9 % IV SOLN: 250.0000 mL | INTRAVENOUS | Status: DC

## 2018-10-09 MED ORDER — BUPIVACAINE-EPINEPHRINE (PF) 0.25% -1:200000 IJ SOLN
INTRAMUSCULAR | Status: AC
  Filled 2018-10-09: qty 30

## 2018-10-09 MED ORDER — ROCURONIUM BROMIDE 10 MG/ML (PF) SYRINGE
PREFILLED_SYRINGE | INTRAVENOUS | Status: DC | PRN
  Administered 2018-10-09: 20 mg via INTRAVENOUS
  Administered 2018-10-09: 40 mg via INTRAVENOUS
  Administered 2018-10-09: 20 mg via INTRAVENOUS
  Administered 2018-10-09: 40 mg via INTRAVENOUS

## 2018-10-09 MED ORDER — LIDOCAINE 2% (20 MG/ML) 5 ML SYRINGE
INTRAMUSCULAR | Status: DC | PRN
  Administered 2018-10-09: 80 mg via INTRAVENOUS

## 2018-10-09 MED ORDER — CEFAZOLIN SODIUM-DEXTROSE 2-4 GM/100ML-% IV SOLN
INTRAVENOUS | Status: AC
  Filled 2018-10-09: qty 100

## 2018-10-09 MED ORDER — MENTHOL 3 MG MT LOZG: 1.0000 | LOZENGE | OROMUCOSAL | Status: DC | PRN

## 2018-10-09 MED ORDER — POTASSIUM CHLORIDE IN NACL 20-0.9 MEQ/L-% IV SOLN: INTRAVENOUS | Status: DC

## 2018-10-09 MED ORDER — OXYCODONE-ACETAMINOPHEN 5-325 MG PO TABS
1.0000 | ORAL_TABLET | ORAL | Status: DC | PRN
  Administered 2018-10-09 – 2018-10-10 (×6): 2 via ORAL
  Filled 2018-10-09 (×5): qty 2

## 2018-10-09 MED ORDER — SODIUM CHLORIDE 0.9% FLUSH: 3.0000 mL | Freq: Two times a day (BID) | INTRAVENOUS | Status: DC

## 2018-10-09 MED ORDER — MIDAZOLAM HCL 2 MG/2ML IJ SOLN
INTRAMUSCULAR | Status: AC
  Filled 2018-10-09: qty 2

## 2018-10-09 MED ORDER — FENTANYL CITRATE (PF) 250 MCG/5ML IJ SOLN
INTRAMUSCULAR | Status: AC
  Filled 2018-10-09: qty 5

## 2018-10-09 MED ORDER — DOCUSATE SODIUM 100 MG PO CAPS
100.0000 mg | ORAL_CAPSULE | Freq: Two times a day (BID) | ORAL | Status: DC
  Administered 2018-10-09 – 2018-10-10 (×2): 100 mg via ORAL
  Filled 2018-10-09 (×2): qty 1

## 2018-10-09 MED ORDER — DIAZEPAM 5 MG PO TABS
ORAL_TABLET | ORAL | Status: AC
  Filled 2018-10-09: qty 1

## 2018-10-09 MED ORDER — THROMBIN 20000 UNITS EX SOLR
CUTANEOUS | Status: DC | PRN
  Administered 2018-10-09: 20 mL via TOPICAL

## 2018-10-09 MED ORDER — BUPIVACAINE-EPINEPHRINE 0.25% -1:200000 IJ SOLN
INTRAMUSCULAR | Status: DC | PRN
  Administered 2018-10-09: 10 mL
  Administered 2018-10-09: 8 mL

## 2018-10-09 MED ORDER — BUPIVACAINE LIPOSOME 1.3 % IJ SUSP
20.0000 mL | INTRAMUSCULAR | Status: AC
  Administered 2018-10-09: 10 mL
  Filled 2018-10-09: qty 20

## 2018-10-09 MED ORDER — SUGAMMADEX SODIUM 200 MG/2ML IV SOLN
INTRAVENOUS | Status: DC | PRN
  Administered 2018-10-09: 200 mg via INTRAVENOUS

## 2018-10-09 MED ORDER — CEFAZOLIN SODIUM-DEXTROSE 2-4 GM/100ML-% IV SOLN
2.0000 g | Freq: Three times a day (TID) | INTRAVENOUS | Status: AC
  Administered 2018-10-09 – 2018-10-10 (×2): 2 g via INTRAVENOUS
  Filled 2018-10-09 (×2): qty 100

## 2018-10-09 MED ORDER — ALBUMIN HUMAN 5 % IV SOLN
INTRAVENOUS | Status: DC | PRN
  Administered 2018-10-09 (×2): via INTRAVENOUS

## 2018-10-09 MED ORDER — ACETAMINOPHEN 10 MG/ML IV SOLN: 1000.0000 mg | Freq: Once | INTRAVENOUS | Status: DC | PRN

## 2018-10-09 MED ORDER — METHYLENE BLUE 0.5 % INJ SOLN
INTRAVENOUS | Status: AC
  Filled 2018-10-09: qty 10

## 2018-10-09 MED ORDER — HYDROMORPHONE HCL 1 MG/ML IJ SOLN
INTRAMUSCULAR | Status: DC | PRN
  Administered 2018-10-09: 0.5 mg via INTRAVENOUS

## 2018-10-09 MED ORDER — FLEET ENEMA 7-19 GM/118ML RE ENEM: 1.0000 | ENEMA | Freq: Once | RECTAL | Status: DC | PRN

## 2018-10-09 MED ORDER — METHYLENE BLUE 0.5 % INJ SOLN
INTRAVENOUS | Status: DC | PRN
  Administered 2018-10-09: 2 mL

## 2018-10-09 MED ORDER — DEXAMETHASONE SODIUM PHOSPHATE 10 MG/ML IJ SOLN
INTRAMUSCULAR | Status: DC | PRN
  Administered 2018-10-09: 10 mg via INTRAVENOUS

## 2018-10-09 MED ORDER — MORPHINE SULFATE (PF) 2 MG/ML IV SOLN
1.0000 mg | INTRAVENOUS | Status: DC | PRN
  Administered 2018-10-09: 2 mg via INTRAVENOUS
  Filled 2018-10-09: qty 1

## 2018-10-09 MED ORDER — OXYCODONE HCL 5 MG/5ML PO SOLN: 5.0000 mg | Freq: Once | ORAL | Status: DC | PRN

## 2018-10-09 MED ORDER — ALUM & MAG HYDROXIDE-SIMETH 200-200-20 MG/5ML PO SUSP: 30.0000 mL | Freq: Four times a day (QID) | ORAL | Status: DC | PRN

## 2018-10-09 MED ORDER — BISACODYL 5 MG PO TBEC: 5.0000 mg | DELAYED_RELEASE_TABLET | Freq: Every day | ORAL | Status: DC | PRN

## 2018-10-09 MED ORDER — ACETAMINOPHEN 325 MG PO TABS: 650.0000 mg | ORAL_TABLET | ORAL | Status: DC | PRN

## 2018-10-09 SURGICAL SUPPLY — 94 items
BENZOIN TINCTURE PRP APPL 2/3 (GAUZE/BANDAGES/DRESSINGS) ×3 IMPLANT
BLADE CLIPPER SURG (BLADE) ×3 IMPLANT
BONE VIVIGEN FORMABLE 10CC (Bone Implant) ×3 IMPLANT
BUR PRESCISION 1.7 ELITE (BURR) ×3 IMPLANT
BUR ROUND FLUTED 5 RND (BURR) ×2 IMPLANT
BUR ROUND FLUTED 5MM RND (BURR) ×1
BUR ROUND PRECISION 4.0 (BURR) IMPLANT
BUR ROUND PRECISION 4.0MM (BURR)
BUR SABER RD CUTTING 3.0 (BURR) IMPLANT
BUR SABER RD CUTTING 3.0MM (BURR)
CAGE CONCORDE BULLET 11X12X27 (Cage) ×2 IMPLANT
CAGE SPNL PRLL BLT NOSE 27X11 (Cage) ×1 IMPLANT
CARTRIDGE OIL MAESTRO DRILL (MISCELLANEOUS) ×1 IMPLANT
CLOSURE STERI-STRIP 1/2X4 (GAUZE/BANDAGES/DRESSINGS) ×1
CLOSURE WOUND 1/2 X4 (GAUZE/BANDAGES/DRESSINGS) ×2
CLSR STERI-STRIP ANTIMIC 1/2X4 (GAUZE/BANDAGES/DRESSINGS) ×2 IMPLANT
CONT SPEC 4OZ CLIKSEAL STRL BL (MISCELLANEOUS) ×3 IMPLANT
COVER BACK TABLE 60X90IN (DRAPES) ×3 IMPLANT
COVER MAYO STAND STRL (DRAPES) IMPLANT
COVER SURGICAL LIGHT HANDLE (MISCELLANEOUS) IMPLANT
COVER WAND RF STERILE (DRAPES) ×3 IMPLANT
DIFFUSER DRILL AIR PNEUMATIC (MISCELLANEOUS) ×3 IMPLANT
DRAIN CHANNEL 15F RND FF W/TCR (WOUND CARE) IMPLANT
DRAPE C-ARM 42X72 X-RAY (DRAPES) ×3 IMPLANT
DRAPE C-ARMOR (DRAPES) ×3 IMPLANT
DRAPE POUCH INSTRU U-SHP 10X18 (DRAPES) IMPLANT
DRAPE SURG 17X23 STRL (DRAPES) ×12 IMPLANT
DURAPREP 26ML APPLICATOR (WOUND CARE) ×3 IMPLANT
ELECT BLADE 4.0 EZ CLEAN MEGAD (MISCELLANEOUS) ×3
ELECT CAUTERY BLADE 6.4 (BLADE) IMPLANT
ELECT REM PT RETURN 9FT ADLT (ELECTROSURGICAL) ×3
ELECTRODE BLDE 4.0 EZ CLN MEGD (MISCELLANEOUS) ×1 IMPLANT
ELECTRODE REM PT RTRN 9FT ADLT (ELECTROSURGICAL) ×1 IMPLANT
EVACUATOR SILICONE 100CC (DRAIN) IMPLANT
FEE INTRAOP MONITOR IMPULS NCS (MISCELLANEOUS) ×1 IMPLANT
FILTER STRAW FLUID ASPIR (MISCELLANEOUS) ×3 IMPLANT
GAUZE 4X4 16PLY RFD (DISPOSABLE) IMPLANT
GAUZE SPONGE 4X4 12PLY STRL (GAUZE/BANDAGES/DRESSINGS) IMPLANT
GAUZE SPONGE 4X4 12PLY STRL LF (GAUZE/BANDAGES/DRESSINGS) ×3 IMPLANT
GLOVE BIO SURGEON STRL SZ7 (GLOVE) ×3 IMPLANT
GLOVE BIO SURGEON STRL SZ8 (GLOVE) ×3 IMPLANT
GLOVE BIOGEL PI IND STRL 6.5 (GLOVE) ×1 IMPLANT
GLOVE BIOGEL PI IND STRL 7.0 (GLOVE) ×3 IMPLANT
GLOVE BIOGEL PI IND STRL 8 (GLOVE) ×1 IMPLANT
GLOVE BIOGEL PI INDICATOR 6.5 (GLOVE) ×2
GLOVE BIOGEL PI INDICATOR 7.0 (GLOVE) ×6
GLOVE BIOGEL PI INDICATOR 8 (GLOVE) ×2
GLOVE SURG SS PI 6.0 STRL IVOR (GLOVE) ×9 IMPLANT
GLOVE SURG SS PI 7.5 STRL IVOR (GLOVE) ×15 IMPLANT
GOWN STRL REUS W/ TWL LRG LVL3 (GOWN DISPOSABLE) ×5 IMPLANT
GOWN STRL REUS W/ TWL XL LVL3 (GOWN DISPOSABLE) ×1 IMPLANT
GOWN STRL REUS W/TWL LRG LVL3 (GOWN DISPOSABLE) ×10
GOWN STRL REUS W/TWL XL LVL3 (GOWN DISPOSABLE) ×2
INTRAOP MONITOR FEE IMPULS NCS (MISCELLANEOUS) ×1
INTRAOP MONITOR FEE IMPULSE (MISCELLANEOUS) ×2
IV CATH 14GX2 1/4 (CATHETERS) ×3 IMPLANT
KIT BASIN OR (CUSTOM PROCEDURE TRAY) ×3 IMPLANT
KIT POSITION SURG JACKSON T1 (MISCELLANEOUS) ×3 IMPLANT
KIT TURNOVER KIT B (KITS) ×3 IMPLANT
MARKER SKIN DUAL TIP RULER LAB (MISCELLANEOUS) ×3 IMPLANT
NEEDLE 18GX1X1/2 (RX/OR ONLY) (NEEDLE) IMPLANT
NEEDLE 22X1 1/2 (OR ONLY) (NEEDLE) ×3 IMPLANT
NEEDLE HYPO 25GX1X1/2 BEV (NEEDLE) ×3 IMPLANT
NEEDLE SPNL 18GX3.5 QUINCKE PK (NEEDLE) ×6 IMPLANT
NS IRRIG 1000ML POUR BTL (IV SOLUTION) ×6 IMPLANT
OIL CARTRIDGE MAESTRO DRILL (MISCELLANEOUS) ×3
PACK LAMINECTOMY NEURO (CUSTOM PROCEDURE TRAY) ×3 IMPLANT
PACK UNIVERSAL I (CUSTOM PROCEDURE TRAY) ×3 IMPLANT
PAD ARMBOARD 7.5X6 YLW CONV (MISCELLANEOUS) ×6 IMPLANT
PATTIES SURGICAL .5 X1 (DISPOSABLE) IMPLANT
PATTIES SURGICAL .5X1.5 (GAUZE/BANDAGES/DRESSINGS) IMPLANT
PROBE PEDCLE PROBE MAGSTM DISP (MISCELLANEOUS) ×6 IMPLANT
ROD PRE LORDOSED 5.5X45 (Rod) ×6 IMPLANT
SCREW SET SINGLE INNER (Screw) ×12 IMPLANT
SCREW VIPER CORT FIX 6X35 (Screw) ×12 IMPLANT
SPONGE INTESTINAL PEANUT (DISPOSABLE) ×3 IMPLANT
SPONGE LAP 4X18 RFD (DISPOSABLE) ×3 IMPLANT
SPONGE SURGIFOAM ABS GEL 100 (HEMOSTASIS) ×3 IMPLANT
STRIP CLOSURE SKIN 1/2X4 (GAUZE/BANDAGES/DRESSINGS) ×4 IMPLANT
SURGIFLO W/THROMBIN 8M KIT (HEMOSTASIS) IMPLANT
SUT MNCRL AB 4-0 PS2 18 (SUTURE) ×3 IMPLANT
SUT VIC AB 0 CT1 18XCR BRD 8 (SUTURE) IMPLANT
SUT VIC AB 0 CT1 8-18 (SUTURE)
SUT VIC AB 1 CT1 18XCR BRD 8 (SUTURE) ×1 IMPLANT
SUT VIC AB 1 CT1 8-18 (SUTURE) ×2
SUT VIC AB 2-0 CT2 18 VCP726D (SUTURE) ×6 IMPLANT
SYR 20ML LL LF (SYRINGE) ×6 IMPLANT
SYR BULB IRRIGATION 50ML (SYRINGE) ×3 IMPLANT
SYR CONTROL 10ML LL (SYRINGE) ×3 IMPLANT
SYR TB 1ML LUER SLIP (SYRINGE) ×3 IMPLANT
TAPE CLOTH SURG 4X10 WHT LF (GAUZE/BANDAGES/DRESSINGS) ×3 IMPLANT
TRAY FOLEY MTR SLVR 16FR STAT (SET/KITS/TRAYS/PACK) ×3 IMPLANT
WATER STERILE IRR 1000ML POUR (IV SOLUTION) ×3 IMPLANT
YANKAUER SUCT BULB TIP NO VENT (SUCTIONS) ×3 IMPLANT

## 2018-10-09 NOTE — H&P (Signed)
PREOPERATIVE H&P  Chief Complaint: Right leg pain, low back pain  HPI: Antonio Powers is a 38 y.o. male who presents with ongoing pain in the right leg and ongoing pain in the low back  MRI reveals a large L4/5 disc herniation on the right at L4/5 and severe L4/5 DDD  Patient has failed multiple forms of conservative care and continues to have pain (see office notes for additional details regarding the patient's full course of treatment)  No past medical history on file. Past Surgical History:  Procedure Laterality Date  . APPENDECTOMY     Social History   Socioeconomic History  . Marital status: Married    Spouse name: Not on file  . Number of children: Not on file  . Years of education: Not on file  . Highest education level: Not on file  Occupational History  . Not on file  Social Needs  . Financial resource strain: Not on file  . Food insecurity    Worry: Not on file    Inability: Not on file  . Transportation needs    Medical: Not on file    Non-medical: Not on file  Tobacco Use  . Smoking status: Never Smoker  . Smokeless tobacco: Never Used  Substance and Sexual Activity  . Alcohol use: Not Currently    Frequency: Never  . Drug use: Never  . Sexual activity: Not on file  Lifestyle  . Physical activity    Days per week: Not on file    Minutes per session: Not on file  . Stress: Not on file  Relationships  . Social Musicianconnections    Talks on phone: Not on file    Gets together: Not on file    Attends religious service: Not on file    Active member of club or organization: Not on file    Attends meetings of clubs or organizations: Not on file    Relationship status: Not on file  Other Topics Concern  . Not on file  Social History Narrative  . Not on file   No family history on file. No Known Allergies Prior to Admission medications   Medication Sig Start Date End Date Taking? Authorizing Provider  acetaminophen (TYLENOL) 500 MG tablet Take  1 tablet (500 mg total) by mouth every 6 (six) hours as needed. Patient not taking: Reported on 09/16/2018 09/22/17   Aviva KluverMurray, Alyssa B, PA-C  acetaminophen-codeine (TYLENOL #3) 300-30 MG tablet Take 1 tablet by mouth 3 (three) times daily as needed for pain. 09/08/18   [provider]  gabapentin (NEURONTIN) 300 MG capsule Take 300 mg by mouth daily as needed (pain).  05/16/18   [provider]  ibuprofen (ADVIL,MOTRIN) 600 MG tablet Take 1 tablet (600 mg total) by mouth every 6 (six) hours as needed. Patient not taking: Reported on 09/16/2018 09/22/17   Aviva KluverMurray, Alyssa B, PA-C  methocarbamol (ROBAXIN) 500 MG tablet Take 1 tablet (500 mg total) by mouth 2 (two) times daily. Patient not taking: Reported on 09/16/2018 09/22/17   Aviva KluverMurray, Alyssa B, PA-C  Multiple Vitamins-Minerals (MULTIVITAMIN WITH MINERALS) tablet Take 1 tablet by mouth daily.    [provider]     All other systems have been reviewed and were otherwise negative with the exception of those mentioned in the HPI and as above.  Physical Exam: There were no vitals filed for this visit.  There is no height or weight on file to calculate BMI.  General: Alert,  no acute distress Cardiovascular: No pedal edema Respiratory: No cyanosis, no use of accessory musculature Skin: No lesions in the area of chief complaint Neurologic: Sensation intact distally Psychiatric: Patient is competent for consent with normal mood and affect Lymphatic: No axillary or cervical lymphadenopathy  MUSCULOSKELETAL: + SLR on the right  Assessment/Plan: RIGHT LUMBAR 5 RADICULOPATHY, LUMBAR 4-LUMBAR 5 HERNIATED DISC, L4/5 DDD Plan for Procedure(s): RIGHT TRANSFORAMINAL LUMBAR INTERBODY FUSION (TLIF) LUMBAR FOUR - FIVE WITH PEDICLE SCREW FIXATION 1 LEVEL   Norva Karvonen, MD 10/09/2018 6:38 AM

## 2018-10-09 NOTE — Anesthesia Postprocedure Evaluation (Signed)
Anesthesia Post Note  Patient: Antonio Powers  Procedure(s) Performed: TRANSFORAMINAL LUMBAR INTERBODY FUSION (TLIF) LUMBAR FOUR THROUGH LUMBAR FIVE WITH PEDICLE SCREW FIXATION (N/A Spine Lumbar)     Patient location during evaluation: PACU Anesthesia Type: General Level of consciousness: awake and alert and oriented Pain management: pain level controlled Vital Signs Assessment: post-procedure vital signs reviewed and stable Respiratory status: spontaneous breathing, nonlabored ventilation and respiratory function stable Cardiovascular status: blood pressure returned to baseline Postop Assessment: no apparent nausea or vomiting Anesthetic complications: no    Last Vitals:  Vitals:   10/09/18 1455 10/09/18 1526  BP: 122/76 130/81  Pulse: 98 93  Resp: 20 18  Temp:  36.9 C  SpO2: 96% 94%    Last Pain:  Vitals:   10/09/18 1538  TempSrc:   PainSc: [place]

## 2018-10-09 NOTE — Op Note (Signed)
PATIENT NAME: Antonio Powers   MEDICAL RECORD NO.:   [id number]    PHYSICIAN:  Phylliss Bob, MD      DATE OF BIRTH: February 04, 1981  ASSISTANT: Pricilla Holm, PA-C   DATE OF PROCEDURE: 10/09/2018                               OPERATIVE REPORT     PREOPERATIVE DIAGNOSES: 1. Right-sided lumbar radiculopathy. 2. Severe L4-5 spinal stenosis. 3. Severe L4-5 degenerative disc disease   POSTOPERATIVE DIAGNOSES: 1. Right-sided lumbar radiculopathy. 2. Severe L4-5 spinal stenosis. 3. Severe L4-5 degenerative disc disease   PROCEDURES: 1. L4/5 decompression 2. Right-sided L4-5 transforaminal lumbar interbody fusion. 3. Left-sided L4-5 posterolateral fusion. 4. Insertion of interbody device x1 (54mm [place] intervertebral spacer). 5. Placement of segmental posterior instrumentation L4, L5 bilaterally  6. Use of local autograft. 7. Use of morselized allograft - Vivigen 8. Intraoperative use of fluoroscopy.   SURGEON:  Phylliss Bob, MD.   ASSISTANTPricilla Holm, PA-C.   ANESTHESIA:  General endotracheal anesthesia.   COMPLICATIONS:  None.   DISPOSITION:  Stable.   ESTIMATED BLOOD LOSS:  100cc   INDICATIONS FOR SURGERY:  Briefly,  Antonio Powers is a pleasant 38 -year-old male who did present to me with severe and ongoing pain in the right leg and low back.  His treatment for this has been rather extensive, involving physical therapy and injections for well over 1 year.  Given his lack of long-term improvement, we did discuss the details of surgery.  He did wish to proceed.  He did clearly understand that surgery would have a higher likelihood of addressing his right leg pain, as compared to his ongoing chronic low back pain.  He did express an understanding of this and did wish to proceed with surgery.   OPERATIVE DETAILS:  On 10/09/2018, the patient was brought to surgery and general endotracheal anesthesia was administered.  The patient was placed prone on a well-padded flat  Jackson bed with a spinal frame.  Antibiotics were given and a time-out procedure was performed. The back was prepped and draped in the usual fashion.  A midline incision was made overlying the L4-5 intervertebral spaces.  The fascia was incised at the midline.  The paraspinal musculature was bluntly swept laterally.  Anatomic landmarks for the pedicles were exposed. Using fluoroscopy, I did cannulate the L5 pedicles bilaterally, using a medial to lateral cortical trajectory technique.  Then, on the left side, I did cannulate the L4 and L5 pedicles, also using a medial to lateral technique.  At this point, 6 x 35 mm screws were placed into the left pedicles, and a 45 mm rod was placed into the tulip heads of the screw, and caps were also placed. Distraction was then applied across the L4-5 intervertebral space, and the caps were then provisionally tightened.  On the right side, bone wax was placed into the cannulated pedicle holes.  I then proceeded with the decompressive aspect of the procedure at the L4-5 level.  A partial facetectomy was performed bilaterally at L4-5, decompressing the L4-5 intervertebral space. Then, with an assistant holding medial retraction of the traversing right L5 nerve, I did perform an annulotomy at the posterolateral aspect of the L4-5 intervertebral space.  Significant herniated disc material was noted, clearly compressing the spinal canal.  This was removed in its entirety, resulting in a successful and complete decompression of the L4-5 level. I  then used a series of curettes and pituitary rongeurs to perform a thorough and complete intervertebral diskectomy.  The intervertebral space was then liberally packed with autograft as well as allograft in the form of Vivigen, as was the appropriate-sized intervertebral spacer (12 mm).  The spacer was then tamped into position in the usual fashion.  I was very pleased with the press-fit of the spacer.  I then placed 6 mm screws  on the right at L4 and L5. A 45-mm rod was then placed and caps were placed. The distraction was then released on the contralateral side.  All caps were then locked.  The wound was copiously irrigated with a total of approximately 3 L prior to placing the bone graft.  Additional autograft and allograft was then packed into the posterolateral gutter on the left side to help aid in the success of the fusion.  The wound was  explored for any undue bleeding and there was no substantial bleeding encountered.  Gel-Foam was placed over the laminectomy site.  The wound was then closed in layers using #1 Vicryl followed by 2-0 Vicryl, followed by 4-0 Monocryl.  Benzoin and Steri-Strips were applied followed by sterile dressing.     Of note, did use triggered EMG to test the screws on the right, and there is no screw the tested below 20 mA. There was no sustained abnormal EMG activity noted throughout the surgery.   Of note, Jason CoopKayla McKenzie was my assistant throughout surgery, and did aid in retraction, suctioning, and closure.       Estill BambergMark Idriss Quackenbush, MD

## 2018-10-09 NOTE — Anesthesia Procedure Notes (Addendum)
Procedure Name: Intubation Date/Time: 10/09/2018 11:00 AM Performed by: Cleda Daub, CRNA Pre-anesthesia Checklist: Patient identified, Emergency Drugs available, Suction available and Patient being monitored Patient Re-evaluated:Patient Re-evaluated prior to induction Oxygen Delivery Method: Circle system utilized Preoxygenation: Pre-oxygenation with 100% oxygen Induction Type: IV induction and Rapid sequence Ventilation: Mask ventilation without difficulty Laryngoscope Size: Miller and 2 Grade View: Grade I Tube type: Oral Tube size: 7.5 mm Number of attempts: 1 Placement Confirmation: ETT inserted through vocal cords under direct vision,  positive ETCO2 and breath sounds checked- equal and bilateral Secured at: 23 cm Tube secured with: Tape Dental Injury: Teeth and Oropharynx as per pre-operative assessment

## 2018-10-09 NOTE — Anesthesia Preprocedure Evaluation (Addendum)
Anesthesia Evaluation  Patient identified by MRN, date of birth, ID band Patient awake    Reviewed: Allergy & Precautions, NPO status , Patient's Chart, lab work & pertinent test results  History of Anesthesia Complications Negative for: history of anesthetic complications  Airway Mallampati: II  TM Distance: >3 FB Neck ROM: Full    Dental no notable dental hx.    Pulmonary neg pulmonary ROS,    Pulmonary exam normal        Cardiovascular negative cardio ROS Normal cardiovascular exam     Neuro/Psych L4-5 herniated disk    GI/Hepatic negative GI ROS, Neg liver ROS,   Endo/Other  negative endocrine ROS  Renal/GU negative Renal ROS     Musculoskeletal negative musculoskeletal ROS (+)   Abdominal   Peds  Hematology negative hematology ROS (+)   Anesthesia Other Findings Day of surgery medications reviewed with the patient.  Reproductive/Obstetrics                            Anesthesia Physical Anesthesia Plan  ASA: I  Anesthesia Plan: General   Post-op Pain Management:    Induction: Intravenous  PONV Risk Score and Plan: 3 and Treatment may vary due to age or medical condition, Ondansetron, Dexamethasone and Midazolam  Airway Management Planned: Oral ETT  Additional Equipment:   Intra-op Plan:   Post-operative Plan: Extubation in OR  Informed Consent: I have reviewed the patients History and Physical, chart, labs and discussed the procedure including the risks, benefits and alternatives for the proposed anesthesia with the patient or authorized representative who has indicated his/her understanding and acceptance.     Dental advisory given  Plan Discussed with: CRNA  Anesthesia Plan Comments:        Anesthesia Quick Evaluation

## 2018-10-09 NOTE — Transfer of Care (Signed)
Immediate Anesthesia Transfer of Care Note  Patient: Antonio Powers  Procedure(s) Performed: TRANSFORAMINAL LUMBAR INTERBODY FUSION (TLIF) LUMBAR FOUR THROUGH LUMBAR FIVE WITH PEDICLE SCREW FIXATION (N/A Spine Lumbar)  Patient Location: PACU  Anesthesia Type:General  Level of Consciousness: awake, alert , oriented and patient cooperative  Airway & Oxygen Therapy: Patient Spontanous Breathing and Patient connected to face mask oxygen  Post-op Assessment: Report given to RN and Post -op Vital signs reviewed and stable  Post vital signs: Reviewed and stable  Last Vitals:  Vitals Value Taken Time  BP 140/74 10/09/18 1434  Temp    Pulse 103 10/09/18 1438  Resp 14 10/09/18 1438  SpO2 93 % 10/09/18 1438  Vitals shown include unvalidated device data.  Last Pain:  Vitals:   10/09/18 0856  TempSrc:   PainSc: 5       Patients Stated Pain Goal: 3 (16/55/37 4827)  Complications: No apparent anesthesia complications

## 2018-10-10 MED ORDER — DIAZEPAM 5 MG PO TABS: 5.0000 mg | ORAL_TABLET | Freq: Four times a day (QID) | ORAL | 0 refills | Status: AC | PRN

## 2018-10-10 MED ORDER — OXYCODONE-ACETAMINOPHEN 5-325 MG PO TABS: 1.0000 | ORAL_TABLET | ORAL | 0 refills | Status: AC | PRN

## 2018-10-10 NOTE — Progress Notes (Signed)
    Patient doing well PO day 1 S/P lumbar fusion with resolved leg pain and minimal PO LBP. Pt reports he is doing well and eager to progress home    Physical Exam: Vitals:   10/10/18 0421 10/10/18 0421  BP: 118/80 118/80  Pulse: 72 70  Resp: 20 20  Temp: 97.6 F (36.4 C) 97.6 F (36.4 C)  SpO2: 99% 99%    Dressing in place, CDI, pt sitting up at edge of bed comfortably, dressing CDI, TLSO at bedside NVI  POD #1 s/p lumbar fusion with resolved leg pain and minimal PO LBP  - up with PT/OT, encourage ambulation - Percocet for pain, Valium for muscle spasms  -Sent electronically to pharmacy  - likely d/c home today with f/u in 2 weeks

## 2018-10-10 NOTE — Evaluation (Signed)
Occupational Therapy Evaluation Patient Details Name: Antonio Powers MRN: [id number] DOB: Sep 07, 1980 Today's Date: 10/10/2018    History of Present Illness Patient is a 38 y/o male who presents s/p L4-5 TLIF 10/09/2018. No PMH.   Clinical Impression   Pt educated in compensatory strategies for ADL, use of AE for LB bathing and dressing, brace use and activities to avoid while healing. Pt verbalized understanding of all information. No further OT needs. Pt has 24 hour care at home.    Follow Up Recommendations  No OT follow up    Equipment Recommendations  None recommended by OT    Recommendations for Other Services       Precautions / Restrictions Precautions Precautions: Back Precaution Booklet Issued: Yes (comment) Precaution Comments: Reviewed handout and precautions Required Braces or Orthoses: Spinal Brace Spinal Brace: Thoracolumbosacral orthotic;Applied in sitting position(except nighttime and trips to the bathroom) Restrictions Weight Bearing Restrictions: No      Mobility Bed Mobility Overal bed mobility: Modified Independent             General bed mobility comments: Cues for log roll technique; HOB flat, no use of rails.  Transfers Overall transfer level: Modified independent Equipment used: None             General transfer comment: Stood from EOB x1 with increased time    Balance Overall balance assessment: Needs assistance Sitting-balance support: Feet supported;No upper extremity supported Sitting balance-Leahy Scale: Good Sitting balance - Comments: Able to donn brace with assist for setup   Standing balance support: During functional activity Standing balance-Leahy Scale: Good                             ADL either performed or assessed with clinical judgement   ADL Overall ADL's : Needs assistance/impaired Eating/Feeding: Independent;Sitting   Grooming: Supervision/safety;Standing Grooming Details (indicate cue type  and reason): instructed in 2 cup method for toothbrushing and to use washcloth when washing face at sink Upper Body Bathing: Minimal assistance;Sitting Upper Body Bathing Details (indicate cue type and reason): recommended long handled bath sponge for back Lower Body Bathing: Maximal assistance;Sit to/from stand Lower Body Bathing Details (indicate cue type and reason): recommended long handled bath sponge and use of reacher to wash and dry feet Upper Body Dressing : Set up;Sitting   Lower Body Dressing: Maximal assistance;Sit to/from stand Lower Body Dressing Details (indicate cue type and reason): educated in use of reacher, will rely on wife for socks, educated in safe footwear Toilet Transfer: Supervision/safety;Ambulation Toilet Transfer Details (indicate cue type and reason): mom has purchased some sort of toilet riser   Toileting - Clothing Manipulation Details (indicate cue type and reason): instructed to avoid twisting with pericare, use of tongs     Functional mobility during ADLs: Supervision/safety General ADL Comments: pt will have assist for IADL, instructed in proper body mechanics     Vision Patient Visual Report: No change from baseline       Perception     Praxis      Pertinent Vitals/Pain Pain Assessment: Faces Pain Score: 8  Faces Pain Scale: Hurts even more Pain Location: back Pain Descriptors / Indicators: Operative site guarding;Sore Pain Intervention(s): Monitored during session;Premedicated before session;Repositioned     Hand Dominance Right   Extremity/Trunk Assessment Upper Extremity Assessment Upper Extremity Assessment: Overall WFL for tasks assessed   Lower Extremity Assessment Lower Extremity Assessment: Defer to PT evaluation   Cervical /  Trunk Assessment Cervical / Trunk Assessment: Other exceptions Cervical / Trunk Exceptions: s/p spine surgery   Communication Communication Communication: No difficulties   Cognition  Arousal/Alertness: Awake/alert Behavior During Therapy: WFL for tasks assessed/performed Overall Cognitive Status: Within Functional Limits for tasks assessed                                     General Comments       Exercises     Shoulder Instructions      Home Living Family/patient expects to be discharged to:: Private residence Living Arrangements: Parent;Other relatives;Spouse/significant other Available Help at Discharge: Family;Available 24 hours/day Type of Home: House Home Access: Stairs to enter Enter[employer] CorporationEntrance Stairs-Number of Steps: 1   Home Layout: Two level Alternate Level Stairs-Number of Steps: 1 flight Alternate Level Stairs-Rails: Right Bathroom Shower/Tub: Producer, television/film/videoWalk-in shower   Bathroom Toilet: Standard     Home Equipment: Shower seat;Toilet riser;Adaptive equipment Adaptive Equipment: Reacher Additional Comments: borrowing seat from mom      Prior Functioning/Environment Level of Independence: Independent        Comments: Works as Naval architecttruck driver; 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, occupational4 wheeler, Goes to gym everday before work before back pain        OT Problem List:        OT Treatment/Interventions:      OT Goals(Current goals can be found in the care plan section) Acute Rehab OT Goals Patient Stated Goal: to return to PLOF  OT Frequency:     Barriers to D/C:            Co-evaluation              AM-PAC OT "6 Clicks" Daily Activity     Outcome Measure Help from another person eating meals?: None Help from another person taking care of personal grooming?: None Help from another person toileting, which includes using toliet, bedpan, or urinal?: None Help from another person bathing (including washing, rinsing, drying)?: A Lot Help from another person to put on and taking off regular upper body clothing?: None Help from another person to put on and taking off regular lower body clothing?: A Lot 6 Click Score: 20   End of Session Equipment  Utilized During Treatment: Gait belt;Back brace  Activity Tolerance: Patient tolerated treatment well Patient left: in bed;with call bell/phone within reach  OT Visit Diagnosis: Other abnormalities of gait and mobility (R26.89);Pain                Time: [id number] OT Time Calculation (min): 15 min Charges:  OT General Charges $OT Visit: 1 Visit OT Evaluation $OT Eval Low Complexity: 1 Low  Martie RoundJulie Kavitha Lansdale, OTR/L Acute Rehabilitation Services Pager: [phone] Office: [phone]  Evern BioMayberry, Leviathan Macera Lynn 10/10/2018, 10:44 AM

## 2018-10-10 NOTE — Progress Notes (Signed)
Patient is discharged from room 3C10 at this time. Alert and in stable condition. IV site d/c'd and instructions read to patient with understanding verbalized. Left unit via wheelchair with all belongings at side. 

## 2018-10-10 NOTE — Evaluation (Signed)
Physical Therapy Evaluation Patient Details Name: Antonio Powers MRN: [id number] DOB: 11/20/1980 Today's Date: 10/10/2018   History of Present Illness  Patient is a 38 y/o male who presents s/p L4-5 TLIF 10/09/2018. No PMH.  Clinical Impression  Patient presents with pain and post surgical deficits s/p above surgery. 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 Will have support from mother and sister at d/c. Today, pt requires supervision-Mod I for ambulation. Tolerated stair training with supervision for safety. Education re: back precautions, handout, positioning, log roll technique, back brace, walking program etc. Pt does not require further skilled therapy services as pt functioning at supervision-Mod I level and will have assist at home. All education complete. Discharge from therapy.    Follow Up Recommendations No PT follow up;Supervision - Intermittent    Equipment Recommendations  None recommended by PT    Recommendations for Other Services       Precautions / Restrictions Precautions Precautions: Back Precaution Booklet Issued: Yes (comment) Precaution Comments: Reviewed handout and precautions Required Braces or Orthoses: Spinal Brace Spinal Brace: Thoracolumbosacral orthotic;Applied in sitting position(except night time trips to bathroom) Restrictions Weight Bearing Restrictions: No      Mobility  Bed Mobility Overal bed mobility: Modified Independent             General bed mobility comments: Cues for log roll technique; HOB flat, no use of rails.  Transfers Overall transfer level: Modified independent Equipment used: None             General transfer comment: Stood from EOB x1 without difficulty.  Ambulation/Gait Ambulation/Gait assistance: Modified independent (Device/Increase time) Gait Distance (Feet): 350 Feet Assistive device: None Gait Pattern/deviations: Step-through pattern;Wide base of support Gait velocity: Decreased Gait velocity  interpretation: 1.31 - 2.62 ft/sec, indicative of limited community ambulator General Gait Details: Slow, mostly steady gait with wide BoS; guarded at times.  Stairs Stairs: Yes Stairs assistance: Supervision Stair Management: One rail Left;Step to pattern Number of Stairs: 13 General stair comments: Cues for safety/technique  Wheelchair Mobility    Modified Rankin (Stroke Patients Only)       Balance Overall balance assessment: Needs assistance Sitting-balance support: Feet supported;No upper extremity supported Sitting balance-Leahy Scale: Good Sitting balance - Comments: Able to donn brace with assist for setup   Standing balance support: During functional activity Standing balance-Leahy Scale: Good                               Pertinent Vitals/Pain Pain Assessment: 0-10 Pain Score: 8  Pain Location: back Pain Descriptors / Indicators: Operative site guarding;Sore Pain Intervention(s): Repositioned;Monitored during session;Premedicated before session    Home Living Family/patient expects to be discharged to:: Private residence Living Arrangements: Parent;Other relatives(mom coming to stay, also assist from sister) Available Help at Discharge: Family;Available 24 hours/day Type of Home: House Home Access: Stairs to enter   [employer] CorporationEntrance Stairs-Number of Steps: 1 Home Layout: Two level Home Equipment: Shower seat Additional Comments: borrowing seat from mom    Prior Function Level of Independence: Independent         Comments: Works as Naval architecttruck driver; 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, Goes to gym everday before work before back pain     Higher education careers adviserHand Dominance        Extremity/Trunk Assessment   Upper Extremity Assessment Upper Extremity Assessment: Defer to OT evaluation    Lower Extremity Assessment Lower Extremity Assessment: Overall WFL for tasks assessed  Cervical / Trunk Assessment Cervical / Trunk Assessment: Other exceptions Cervical / Trunk  Exceptions: s/p spine surgery  Communication   Communication: No difficulties  Cognition Arousal/Alertness: Awake/alert Behavior During Therapy: WFL for tasks assessed/performed Overall Cognitive Status: Within Functional Limits for tasks assessed                                        General Comments      Exercises     Assessment/Plan    PT Assessment Patent does not need any further PT services  PT Problem List         PT Treatment Interventions      PT Goals (Current goals can be found in the Care Plan section)  Acute Rehab PT Goals Patient Stated Goal: to return to PLOF PT Goal Formulation: All assessment and education complete, DC therapy    Frequency     Barriers to discharge        Co-evaluation               AM-PAC PT "6 Clicks" Mobility  Outcome Measure Help needed turning from your back to your side while in a flat bed without using bedrails?: None Help needed moving from lying on your back to sitting on the side of a flat bed without using bedrails?: None Help needed moving to and from a bed to a chair (including a wheelchair)?: None Help needed standing up from a chair using your arms (e.g., wheelchair or bedside chair)?: None Help needed to walk in hospital room?: None Help needed climbing 3-5 steps with a railing? : A Little 6 Click Score: 23    End of Session Equipment Utilized During Treatment: Back brace Activity Tolerance: Patient tolerated treatment well Patient left: in bed;with call bell/phone within reach Nurse Communication: Mobility status PT Visit Diagnosis: Pain;Unsteadiness on feet (R26.81) Pain - part of body: (back)    Time: 4174-0814 PT Time Calculation (min) (ACUTE ONLY): 18 min   Charges:   PT Evaluation $PT Eval Moderate Complexity: 1 Mod          Wray Kearns, PT, DPT Acute Rehabilitation Services Pager [phone] Office [place] 10/10/2018, 8:48  AM

## 2018-10-13 ENCOUNTER — Encounter (HOSPITAL_COMMUNITY): Payer: Self-pay | Admitting: Orthopedic Surgery

## 2018-10-14 MED FILL — Heparin Sodium (Porcine) Inj 1000 Unit/ML: INTRAMUSCULAR | Qty: 30 | Status: AC

## 2018-10-14 MED FILL — Sodium Chloride IV Soln 0.9%: INTRAVENOUS | Qty: 1000 | Status: AC

## 2018-10-29 NOTE — Discharge Summary (Signed)
Patient ID: Antonio FairMichael A Haff MRN: [id number] DOB/AGE: 40/05/1980 40 y.o.  Admit date: 10/09/2018 Discharge date: 10/10/2018  Admission Diagnoses:  Active Problems:   Radiculopathy   Discharge Diagnoses:  Same  History reviewed. No pertinent past medical history.  Surgeries: Procedure(s): TRANSFORAMINAL LUMBAR INTERBODY FUSION (TLIF) LUMBAR FOUR THROUGH LUMBAR FIVE WITH PEDICLE SCREW FIXATION on 10/09/2018   Consultants: None  Discharged Condition: Improved  Hospital Course: Antonio Powers is an 38 y.o. male who was admitted 10/09/2018 for operative treatment of radiculopathy. Patient has severe unremitting pain that affects sleep, daily activities, and work/hobbies. After pre-op clearance the patient was taken to the operating room on 10/09/2018 and underwent  Procedure(s): TRANSFORAMINAL LUMBAR INTERBODY FUSION (TLIF) LUMBAR FOUR THROUGH LUMBAR FIVE WITH PEDICLE SCREW FIXATION.    Patient was given perioperative antibiotics:  Anti-infectives (From admission, onward)   Start     Dose/Rate Route Frequency Ordered Stop   10/09/18 1900  ceFAZolin (ANCEF) IVPB 2g/100 mL premix     2 g 200 mL/hr over 30 Minutes Intravenous Every 8 hours 10/09/18 1516 10/10/18 0958   10/09/18 1015  ceFAZolin (ANCEF) IVPB 2g/100 mL premix     2 g 200 mL/hr over 30 Minutes Intravenous  Once 10/09/18 0911 10/09/18 1105   10/09/18 0910  ceFAZolin (ANCEF) 2-4 GM/100ML-% IVPB    Note to Pharmacy: Larose HiresForte, Lindsi   : cabinet override      10/09/18 0910 10/09/18 1100       Patient was given sequential compression devices, early ambulation to prevent DVT.  Patient benefited maximally from hospital stay and there were no complications.    Recent vital signs: BP 130/78 (BP Location: Right Arm)   Pulse 89   Temp 98.1 F (36.7 C) (Oral)   Resp 18   Ht 6\' 1"  (1.854 m)   Wt 95.3 kg   SpO2 98%   BMI 27.71 kg/m    Discharge Medications:   Allergies as of 10/10/2018   No Known Allergies      Medication List    TAKE these medications   diazepam 5 MG tablet Commonly known as: VALIUM Take 1 tablet (5 mg total) by mouth every 6 (six) hours as needed for muscle spasms.   multivitamin with minerals tablet Take 1 tablet by mouth daily.   oxyCODONE-acetaminophen 5-325 MG tablet Commonly known as: PERCOCET/ROXICET Take 1-2 tablets by mouth every 4 (four) hours as needed for moderate pain or severe pain.       Diagnostic Studies: Dg Lumbar Spine 2-3 Views  Result Date: 10/09/2018 CLINICAL DATA:  L4-L5 fusion. EXAM: LUMBAR SPINE - 2-3 VIEW COMPARISON:  Intraoperative radiograph. FINDINGS: Intraoperative fluoroscopic images from transforaminal lumbar interbody fusion at L4-L5 demonstrate normal alignment of the orthopedic components and the visualized vertebral bodies. No evidence of fracture. Fluoroscopy time is recorded as 49 seconds. IMPRESSION: Intraoperative fluoroscopic images from transforaminal lumbar interbody fusion at L4-L5. Electronically Signed   By: Ted Mcalpineobrinka  Dimitrova M.D.   On: 10/09/2018 16:32   Dg Lumbar Spine 1 View  Result Date: 10/09/2018 CLINICAL DATA:  Lumbar surgery. EXAM: LUMBAR SPINE - 1 VIEW COMPARISON:  Lumbar spine radiographs 09/22/2017 FINDINGS: A single portable cross-table lateral radiograph of the lumbar spine is provided. Two needles project over the posterior soft tissues, 1 between the L3 and L4 spinous processes and 1 posterior to the L5 spinous process. IMPRESSION: Intraoperative localization as above. Electronically Signed   By: Sebastian AcheAllen  Grady M.D.   On: 10/09/2018 16:35   Dg  C-arm 1-60 Min  Result Date: 10/09/2018 CLINICAL DATA:  Intraoperative fluoroscopic images from L4-L5 fusion. EXAM: DG C-ARM 61-120 MIN COMPARISON:  Intraoperative radiograph. FINDINGS: Intraoperative fluoroscopic images from transforaminal lumbar interbody fusion at L4-L5 demonstrate normal alignment of the orthopedic components and the visualized vertebral bodies. No evidence of  fracture. Fluoroscopy time is recorded as 49 seconds. IMPRESSION: Intraoperative fluoroscopic images from transforaminal lumbar interbody fusion at L4-L5. Electronically Signed   By: Fidela Salisbury M.D.   On: 10/09/2018 16:54    Disposition: Discharge disposition: 01-Home or Self Care       Discharge Instructions    Discharge patient   Complete by: As directed    Discharge disposition: 01-Home or Self Care   Discharge patient date: 10/10/2018      POD #1 s/p lumbar fusion with resolved leg pain and minimal PO LBP  - up with PT/OT, encourage ambulation - Percocet for pain, Valium for muscle spasms             -Sent electronically to pharmacy  - likely d/c home today with f/u in 2 weeks  Signed: Lennie Muckle Derisha Funderburke 10/29/2018, 9:08 AM

## 2020-09-04 IMAGING — CR LUMBAR SPINE - 1 VIEW
1 series · 1 of 1 positions shown · non-contrast
Comparison: Lumbar spine radiographs 09/22/2017

CLINICAL DATA: Lumbar surgery.

EXAM:
LUMBAR SPINE - 1 VIEW

[lateral]
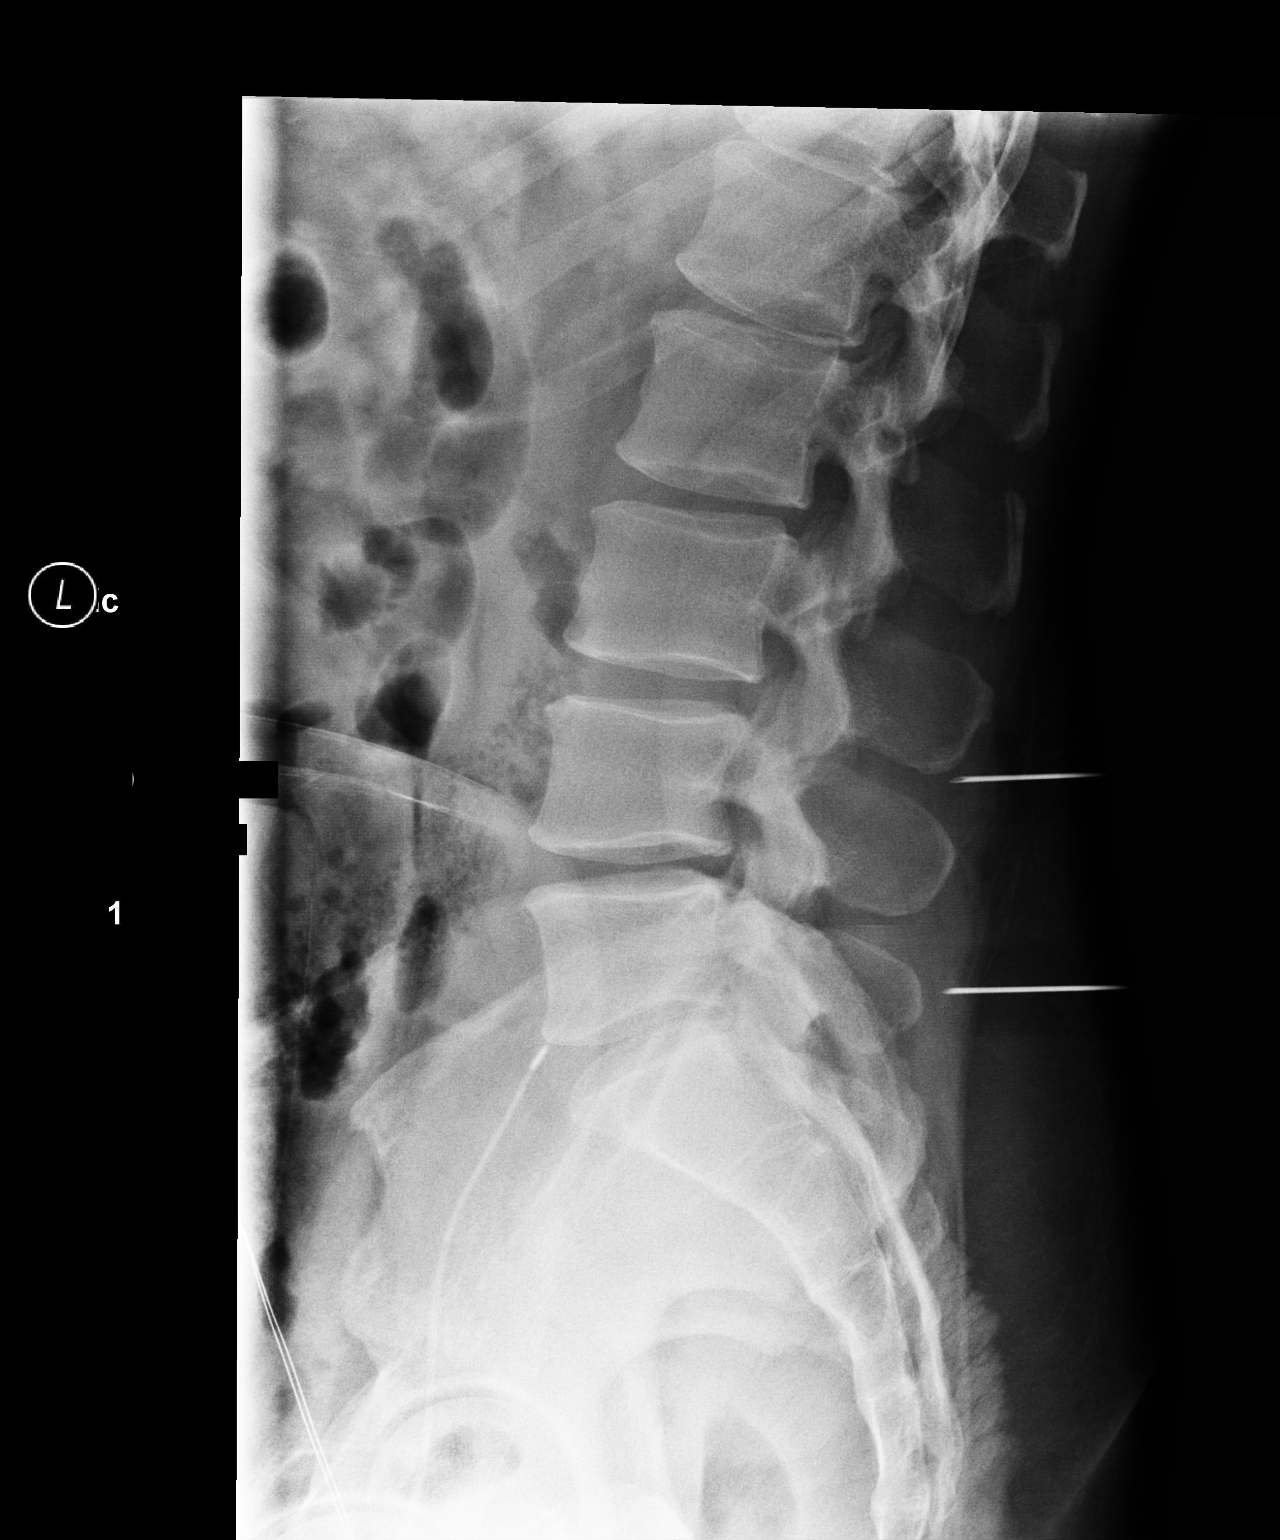

[1 of 1 positions shown; findings below may reference images not displayed]

FINDINGS: A single portable cross-table lateral radiograph of the lumbar spine
is provided. Two needles project over the posterior soft tissues, 1
between the L3 and L4 spinous processes and 1 posterior to the L5
spinous process.
IMPRESSION: Intraoperative localization as above.
